# Patient Record
Sex: Female | Born: 1995 | Race: White | Hispanic: No | Marital: Single | Smoking: Light tobacco smoker
Health system: Southern US, Community
[De-identification: ages and names within clinical notes are randomized; demographics above are authoritative.]

## PROBLEM LIST (undated history)

## (undated) DIAGNOSIS — B009 Herpesviral infection, unspecified: Secondary | ICD-10-CM

## (undated) DIAGNOSIS — T7840XA Allergy, unspecified, initial encounter: Secondary | ICD-10-CM

## (undated) HISTORY — PX: MOUTH SURGERY: SHX715

## (undated) HISTORY — DX: Allergy, unspecified, initial encounter: T78.40XA

## (undated) HISTORY — DX: Herpesviral infection, unspecified: B00.9

---

## 2015-06-30 ENCOUNTER — Ambulatory Visit
Admission: RE | Admit: 2015-06-30 | Discharge: 2015-06-30 | Disposition: A | Discharge status: Home / Self Care | Payer: BLUE CROSS/BLUE SHIELD | Source: Ambulatory Visit | Attending: Family Medicine | Admitting: Family Medicine

## 2015-06-30 ENCOUNTER — Other Ambulatory Visit: Payer: Self-pay | Admitting: Family Medicine

## 2015-06-30 DIAGNOSIS — M79671 Pain in right foot: Secondary | ICD-10-CM | POA: Insufficient documentation

## 2015-06-30 DIAGNOSIS — R52 Pain, unspecified: Secondary | ICD-10-CM

## 2015-06-30 DIAGNOSIS — R609 Edema, unspecified: Secondary | ICD-10-CM | POA: Insufficient documentation

## 2015-09-27 DIAGNOSIS — B009 Herpesviral infection, unspecified: Secondary | ICD-10-CM

## 2015-09-27 HISTORY — DX: Herpesviral infection, unspecified: B00.9

## 2016-01-11 ENCOUNTER — Ambulatory Visit (INDEPENDENT_AMBULATORY_CARE_PROVIDER_SITE_OTHER): Payer: Managed Care, Other (non HMO) | Admitting: Obstetrics & Gynecology

## 2016-01-11 ENCOUNTER — Encounter: Payer: Self-pay | Admitting: Obstetrics & Gynecology

## 2016-01-11 VITALS — BP 101/63 | HR 68 | Ht 66.0 in | Wt 131.0 lb

## 2016-01-11 DIAGNOSIS — Z30011 Encounter for initial prescription of contraceptive pills: Secondary | ICD-10-CM | POA: Diagnosis not present

## 2016-01-11 DIAGNOSIS — Z3041 Encounter for surveillance of contraceptive pills: Secondary | ICD-10-CM

## 2016-01-11 MED ORDER — NORETHIN ACE-ETH ESTRAD-FE 1-20 MG-MCG(24) PO TABS: 1.0000 | ORAL_TABLET | Freq: Every day | ORAL | Status: DC

## 2016-01-11 NOTE — Progress Notes (Signed)
   CLINIC ENCOUNTER NOTE  History:  20 y.o. G0 here because she wants to restart OCPs. Has used Safyral in past, was tired and felt she gained a little weight but she wants to restart OCPs.  She denies any abnormal vaginal discharge, bleeding, pelvic pain or other concerns.   Past Medical History  Diagnosis Date  . HSV-1 (herpes simplex virus 1) infection 2017  . Allergy     seasonal    Past Surgical History  Procedure Laterality Date  . Mouth surgery      The following portions of the patient's history were reviewed and updated as appropriate: allergies, current medications, past family history, past medical history, past social history, past surgical history and problem list.   Health Maintenance:  Has received Gardasil series.  Review of Systems:  Pertinent items noted in HPI and remainder of comprehensive ROS otherwise negative.  Objective:  Physical Exam BP 101/63 mmHg  Pulse 68  Ht 5\' 6"  (1.676 m)  Wt 131 lb (59.421 kg)  BMI 21.15 kg/m2  LMP 01/01/2016 CONSTITUTIONAL: Well-developed, well-nourished female in no acute distress.  HENT:  Normocephalic, atraumatic. External right and left ear normal. Oropharynx is clear and moist EYES: Conjunctivae and EOM are normal. Pupils are equal, round, and reactive to light. No scleral icterus.  NECK: Normal range of motion, supple, no masses SKIN: Skin is warm and dry. No rash noted. Not diaphoretic. No erythema. No pallor. NEUROLOGIC: Alert and oriented to person, place, and time. Normal reflexes, muscle tone coordination. No cranial nerve deficit noted. PSYCHIATRIC: Normal mood and affect. Normal behavior. Normal judgment and thought content. CARDIOVASCULAR: Normal heart rate noted RESPIRATORY: Effort and breath sounds normal, no problems with respiration noted ABDOMEN: Soft, no distention noted.   PELVIC: Deferred MUSCULOSKELETAL: Normal range of motion. No edema noted.   Assessment & Plan:  Oral contraceptive  use Counseled about trial of low dose OCP.  Taytulla sample given, generic form prescribed.  Taytulla savings card given. - Norethindrone Acetate-Ethinyl Estrad-FE (LOESTRIN 24 FE) 1-20 MG-MCG(24) tablet; Take 1 tablet by mouth daily.  Dispense: 1 Package; Refill: 11 Routine preventative health maintenance measures emphasized. Please refer to After Visit Summary for other counseling recommendations.   Return in about 3 months (around 04/11/2016) for OCP and BP check.   Total face-to-face time with patient: 20 minutes. Over 50% of encounter was spent on counseling and coordination of care.   Jaynie CollinsUGONNA  Jannette Cotham, MD, FACOG Attending Obstetrician & Gynecologist, [facility]

## 2016-01-11 NOTE — Patient Instructions (Signed)
Return to clinic for any scheduled appointments or for any gynecologic concerns as needed.   

## 2016-01-11 NOTE — Progress Notes (Signed)
[facility].

## 2016-04-19 ENCOUNTER — Other Ambulatory Visit: Payer: Self-pay | Admitting: Obstetrics & Gynecology

## 2016-04-19 DIAGNOSIS — Z3041 Encounter for surveillance of contraceptive pills: Secondary | ICD-10-CM

## 2016-05-16 ENCOUNTER — Telehealth: Payer: Self-pay | Admitting: *Deleted

## 2016-05-16 DIAGNOSIS — Z3041 Encounter for surveillance of contraceptive pills: Secondary | ICD-10-CM

## 2016-05-16 MED ORDER — NORETHIN ACE-ETH ESTRAD-FE 1-20 MG-MCG(24) PO TABS: 1.0000 | ORAL_TABLET | Freq: Every day | ORAL | 3 refills | Status: DC

## 2016-05-16 NOTE — Telephone Encounter (Signed)
Sent refill on birth control to pharmacy, left pt a message that medication had been filled.

## 2016-05-24 ENCOUNTER — Encounter: Payer: Self-pay | Admitting: Obstetrics & Gynecology

## 2016-05-24 ENCOUNTER — Ambulatory Visit (INDEPENDENT_AMBULATORY_CARE_PROVIDER_SITE_OTHER): Payer: 59 | Admitting: Obstetrics & Gynecology

## 2016-05-24 VITALS — BP 113/64 | HR 72 | Ht 66.0 in | Wt 141.0 lb

## 2016-05-24 DIAGNOSIS — N921 Excessive and frequent menstruation with irregular cycle: Secondary | ICD-10-CM | POA: Diagnosis not present

## 2016-05-24 DIAGNOSIS — R233 Spontaneous ecchymoses: Secondary | ICD-10-CM

## 2016-05-24 DIAGNOSIS — R238 Other skin changes: Secondary | ICD-10-CM

## 2016-05-24 NOTE — Patient Instructions (Signed)
Thank you for enrolling in MyChart. Please follow the instructions below to securely access your online medical record. MyChart allows you to send messages to your doctor, view your test results, manage appointments, and more.   How Do I Sign Up? 1. In your Internet browser, go to [employer] Address [employer] and enter [email or web address]. 2. Click on the Sign Up Now link in the Sign In box. You will see the New Member Sign Up page. 3. Enter your MyChart Access Code exactly as it appears below. You will not need to use this code after you've completed the sign-up process. If you do not sign up before the expiration date, you must request a new code.  MyChart Access Code: [email or web address] Expires: 07/23/2016  3:22 PM  4. Enter your Social Security Number ([id number]) and Date of Birth (mm/dd/yyyy) as indicated and click Submit. You will be taken to the next sign-up page. 5. Create a MyChart ID. This will be your MyChart login ID and cannot be changed, so think of one that is secure and easy to remember. 6. Create a MyChart password. You can change your password at any time. 7. Enter your Password Reset Question and Answer. This can be used at a later time if you forget your password.  8. Enter your e-mail address. You will receive e-mail notification when new information is available in MyChart. 9. Click Sign Up. You can now view your medical record.   Additional Information Remember, MyChart is NOT to be used for urgent needs. For medical emergencies, dial 911.

## 2016-05-25 NOTE — Progress Notes (Signed)
   GYNECOLOGY VISIT NOTE  History:  20 y.o. G0 here today with complaint of one episode of prolonged bleeding. Was recently started on a 20 mcg OCP since 12/2015, had no problems until last month when she had bleeding for 17 days.  Bleeding started after resumption of sexual activity; also had other life changes/stresssors around that time.   No bleeding since, and none currently. She is satisfied with her OCPs. She does want evaluation for chronic history of easy bruising; started intensive exercise lately and noticed more bruising than usual.  Reports she has always bruised easily. She denies any abnormal vaginal discharge, pelvic pain or other concerns.   Past Medical History:  Diagnosis Date  . Allergy    seasonal  . HSV-1 (herpes simplex virus 1) infection 2017    Past Surgical History:  Procedure Laterality Date  . MOUTH SURGERY     The following portions of the patient's history were reviewed and updated as appropriate: allergies, current medications, past family history, past medical history, past social history, past surgical history and problem list.    Review of Systems:  Pertinent items noted in HPI and remainder of comprehensive ROS otherwise negative.  Objective:  Physical Exam BP 113/64   Pulse 72   Ht 5\' 6"  (1.676 m)   Wt 141 lb (64 kg)   LMP 05/04/2016 Comment: 17 days long  BMI 22.76 kg/m  CONSTITUTIONAL: Well-developed, well-nourished female in no acute distress.  HENT:  Normocephalic, atraumatic. External right and left ear normal. Oropharynx is clear and moist EYES: Conjunctivae and EOM are normal. Pupils are equal, round, and reactive to light. No scleral icterus.  NECK: Normal range of motion, supple, no masses SKIN: Skin is warm and dry. No rash noted. Not diaphoretic. No erythema. No pallor. NEUROLOGIC: Alert and oriented to person, place, and time. Normal reflexes, muscle tone coordination. No cranial nerve deficit noted. PSYCHIATRIC: Normal mood and  affect. Normal behavior. Normal judgment and thought content. CARDIOVASCULAR: Normal heart rate noted RESPIRATORY: Effort and breath sounds normal, no problems with respiration noted ABDOMEN: Soft, no distention noted.   PELVIC: Deferred by patient MUSCULOSKELETAL: Normal range of motion. No edema noted.  Labs and Imaging No results found.  Assessment & Plan:  1. Easy bruising Will do initial evaluation. May need referral to Hematology for any abnormal results. - CBC - INR/PT - PTT - Von Willebrand panel  2. Breakthrough bleeding (BTB) on oral contraceptive Will continue current OCPs for now. If BTB continues, evaluate for ectropion (given that bleeding started after resumption of intercourse) or other structural problems.  Also may need to change OCP or increase to 30/35 mcg formulation.   Routine preventative health maintenance measures emphasized. Please refer to After Visit Summary for other counseling recommendations.    Total face-to-face time with patient: 15 minutes. Over 50% of encounter was spent on counseling and coordination of care.   Jaynie CollinsUGONNA  ANYANWU, MD, FACOG Attending Obstetrician & Gynecologist, [facility], [facility]

## 2016-05-27 LAB — CBC
HEMATOCRIT: 40.3 % (ref 35.0–45.0)
HEMOGLOBIN: 13.5 g/dL (ref 11.7–15.5)
MCH: 30.3 pg (ref 27.0–33.0)
MCHC: 33.5 g/dL (ref 32.0–36.0)
MCV: 90.6 fL (ref 80.0–100.0)
MPV: 10.2 fL (ref 7.5–12.5)
Platelets: 279 10*3/uL (ref 140–400)
RBC: 4.45 MIL/uL (ref 3.80–5.10)
RDW: 13.5 % (ref 11.0–15.0)
WBC: 8.2 10*3/uL (ref 3.8–10.8)

## 2016-05-28 LAB — PROTIME-INR
INR: 0.9
Prothrombin Time: 10.1 s (ref 9.0–11.5)

## 2016-05-28 LAB — APTT: APTT: 30 s (ref 22–34)

## 2016-06-02 LAB — VON WILLEBRAND PANEL
Coagulation Factor VIII: 83 % (ref 50–180)
RISTOCETIN CO-FACTOR, PLASMA: 77 % (ref 42–200)
Von Willebrand Antigen, Plasma: 99 % (ref 50–217)

## 2016-06-07 ENCOUNTER — Telehealth: Payer: Self-pay | Admitting: *Deleted

## 2016-06-07 NOTE — Telephone Encounter (Signed)
-----   Message from Tereso NewcomerUgonna A Anyanwu, MD sent at 06/03/2016  8:42 PM EDT ----- Negative evaluation for easy bruising.  Will need to see Hematology if she wants any more specialized testing.  [facility] ----- Message ----- From: Arne ClevelandMandy J Serayah Yazdani, CMA Sent: 06/02/2016   4:11 PM To: Tereso NewcomerUgonna A Anyanwu, MD  Pt called wanting results - Please advise.

## 2016-06-10 ENCOUNTER — Telehealth: Payer: Self-pay | Admitting: *Deleted

## 2016-06-10 DIAGNOSIS — N926 Irregular menstruation, unspecified: Secondary | ICD-10-CM

## 2016-06-10 MED ORDER — NORGESTIMATE-ETH ESTRADIOL 0.25-35 MG-MCG PO TABS: 1.0000 | ORAL_TABLET | Freq: Every day | ORAL | 3 refills | Status: DC

## 2016-06-10 NOTE — Telephone Encounter (Signed)
-----   Message from Olevia BowensJacinda S Battle sent at 06/10/2016 10:49 AM EDT ----- Regarding: OCP/AUB/Advise Contact: [phone] States she was told if she is still having AUB w/ OCP to let us know and we could change her RX or up the dosage. She states she is still bleeding around 9 days at a time

## 2016-06-10 NOTE — Telephone Encounter (Signed)
Called pt, no answer, left message that the physician would increase dosage of the birth control to help with the bleeding.  New rx sent to pharmacy noted in chart.  Instructed pt to call back if that is incorrect and advised to give it up to 3 months to adjust.

## 2016-06-10 NOTE — Telephone Encounter (Signed)
Pt called back [facility] [place] resent to that location.

## 2016-06-17 IMAGING — CR DG FOOT COMPLETE 3+V*R*
1 series · 3 of 3 positions shown · non-contrast
Comparison: None in PACs

CLINICAL DATA: Contusion of the right foot 1 month ago with
persistent pain and swelling over the first metatarsal region

EXAM:
RIGHT FOOT COMPLETE - 3+ VIEW

[Series 1: dg foot complete right · 0.14mm/px · 3 of 3 slices shown]
[im 1/3]
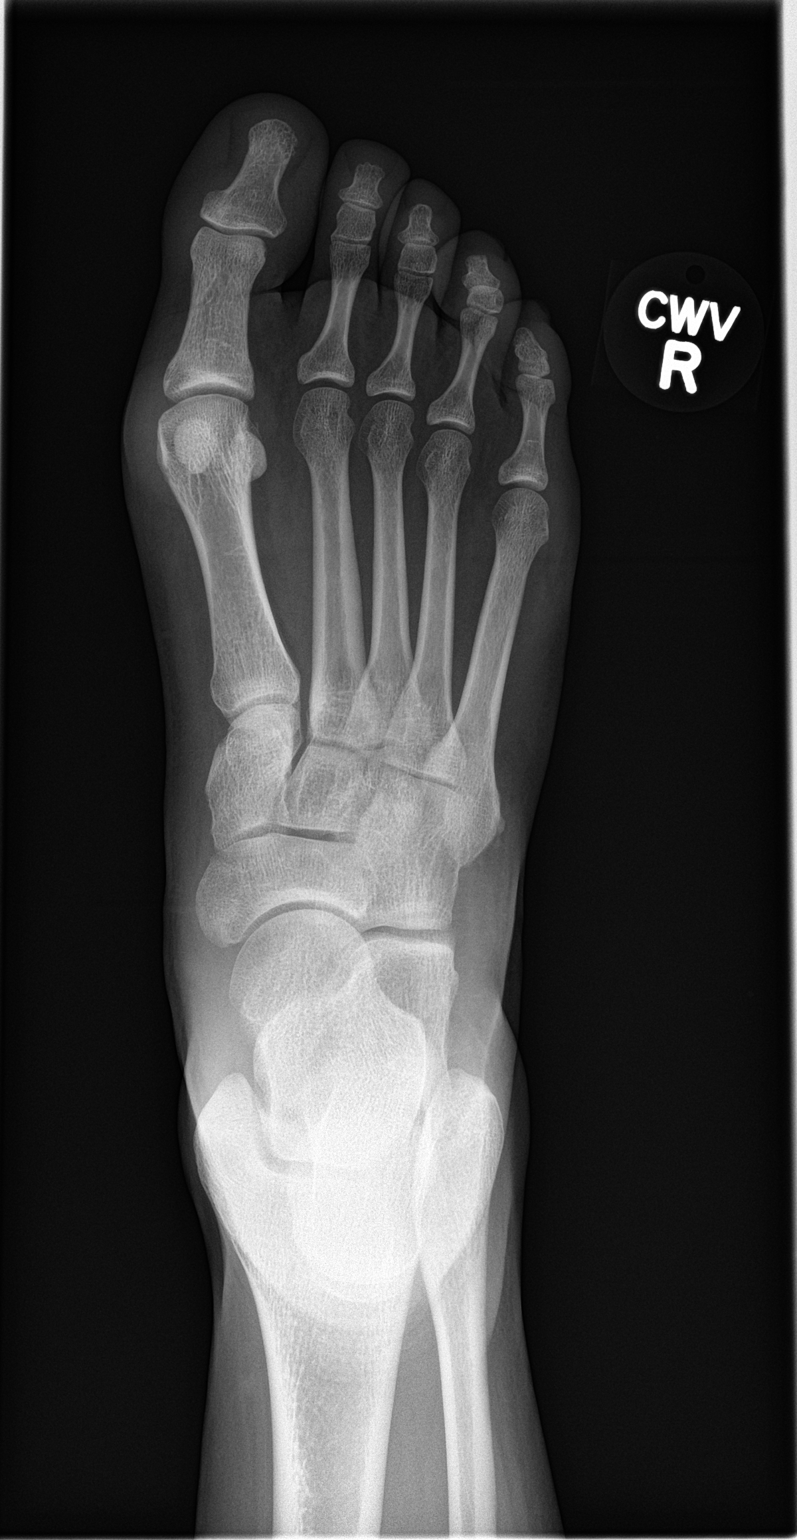
[im 2/3]
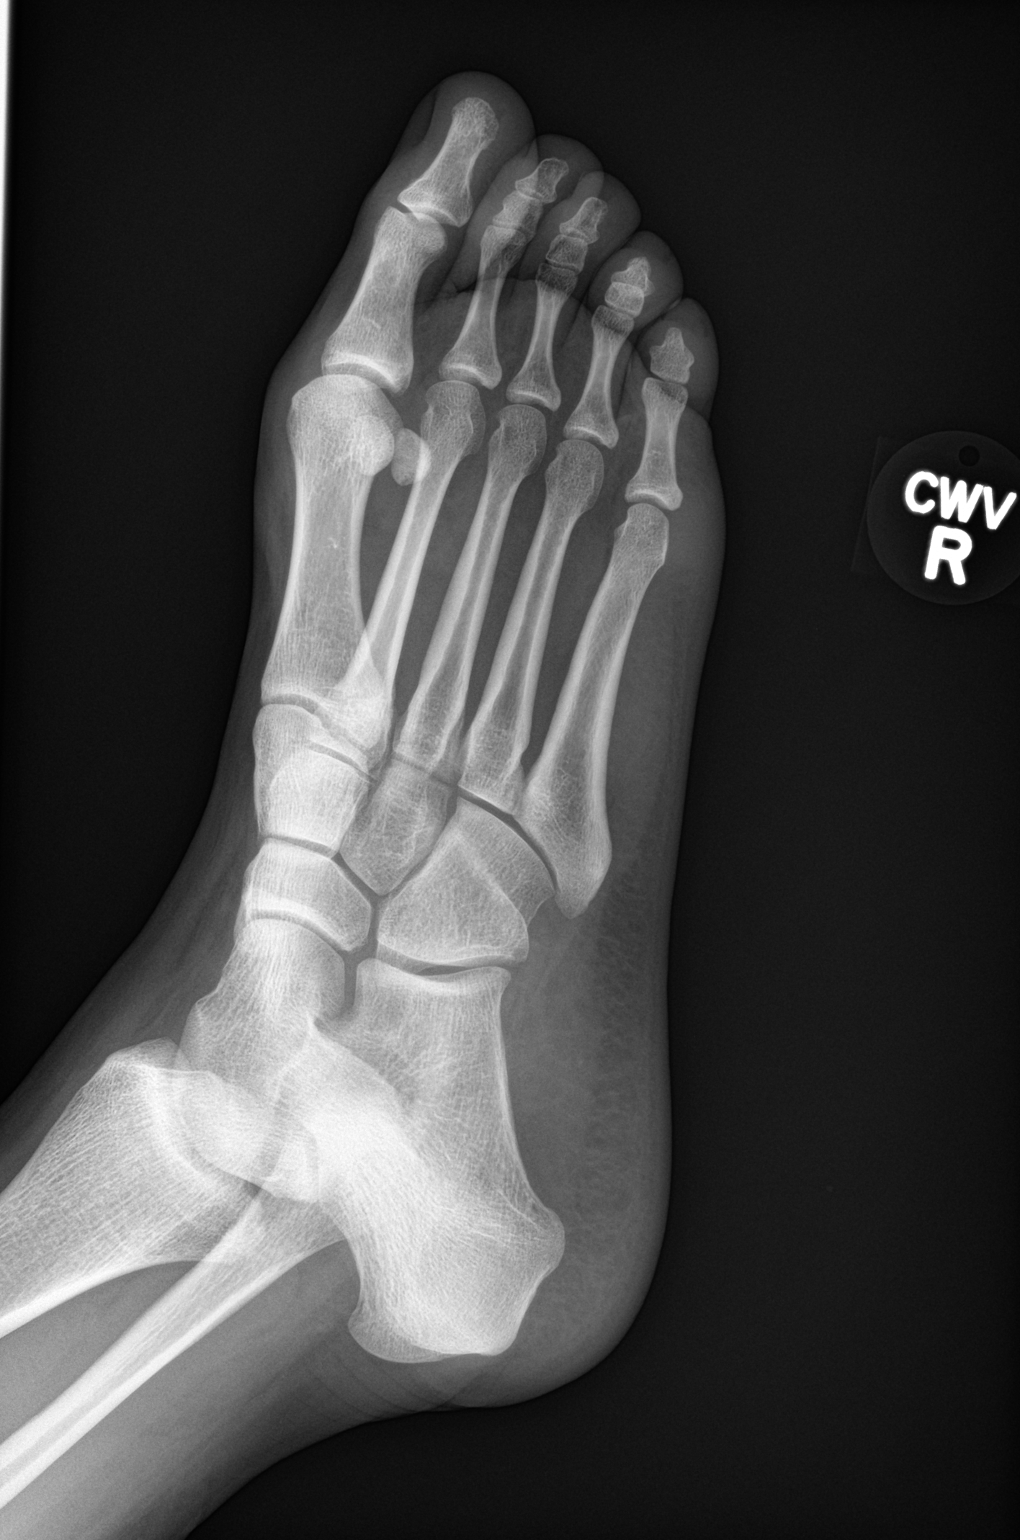
[im 3/3]
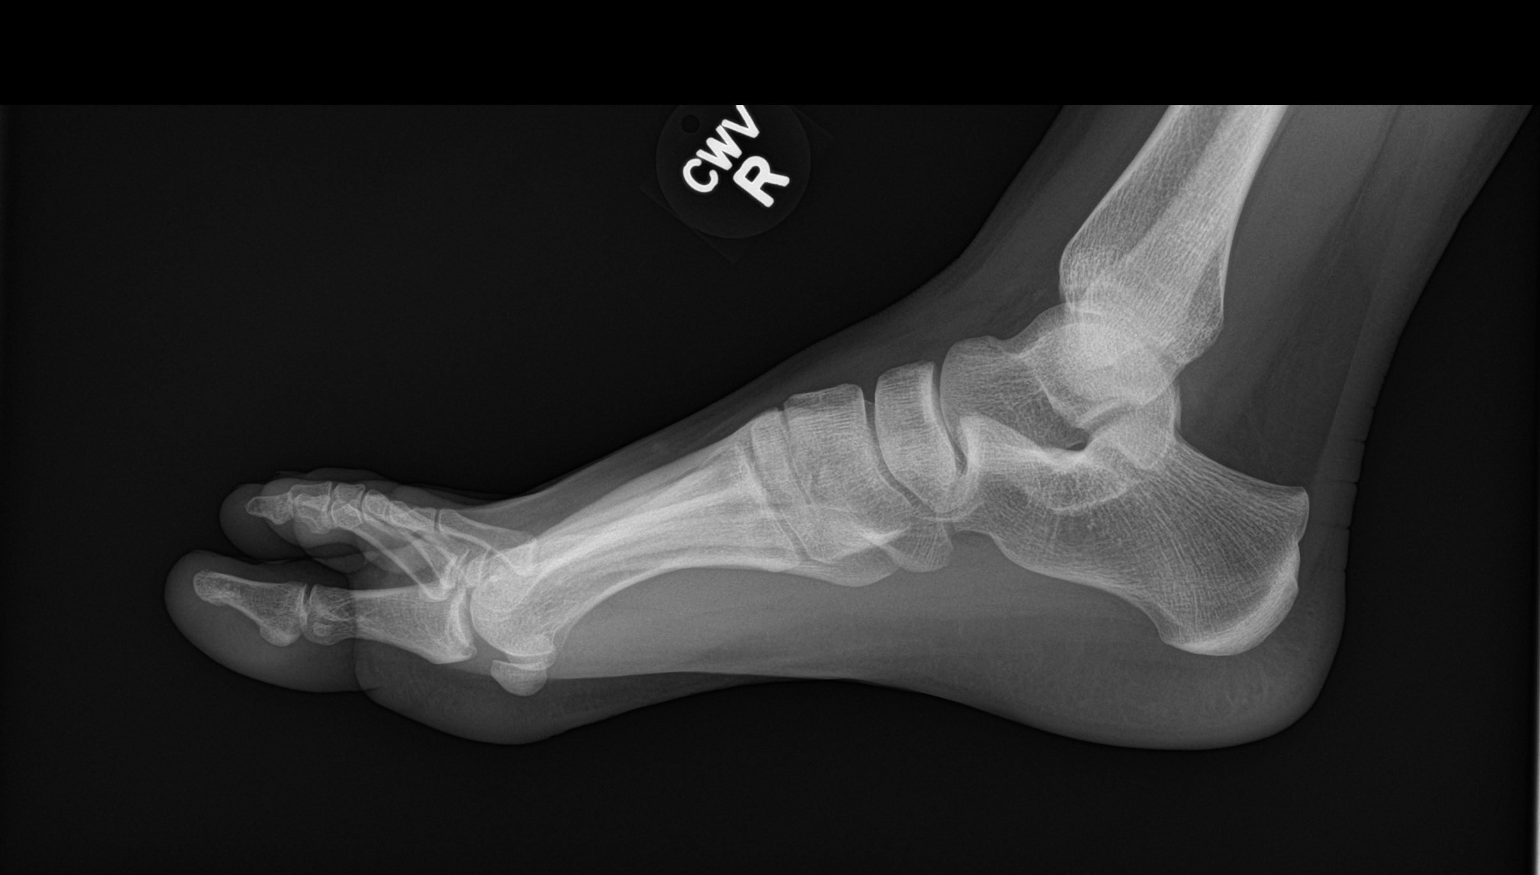

[3 of 3 positions shown; findings below may reference images not displayed]

FINDINGS: The bones of the foot are adequately mineralized. There is no acute
or healing fracture. There is no lytic nor blastic lesion the joint
spaces are well maintained. There is mild soft tissue swelling over
the first MTP joint.
IMPRESSION: There is no acute bony abnormality of the right foot. There is mild
soft tissue swelling surrounding the first MTP joint.

## 2016-08-22 ENCOUNTER — Ambulatory Visit: Payer: 59 | Admitting: Obstetrics & Gynecology

## 2016-08-22 DIAGNOSIS — Z3041 Encounter for surveillance of contraceptive pills: Secondary | ICD-10-CM

## 2017-01-05 ENCOUNTER — Encounter: Payer: Self-pay | Admitting: Obstetrics and Gynecology

## 2017-01-05 ENCOUNTER — Ambulatory Visit (INDEPENDENT_AMBULATORY_CARE_PROVIDER_SITE_OTHER): Payer: 59 | Admitting: Obstetrics and Gynecology

## 2017-01-05 ENCOUNTER — Ambulatory Visit: Payer: 59 | Admitting: Obstetrics and Gynecology

## 2017-01-05 VITALS — BP 122/75 | HR 79 | Ht 66.0 in | Wt 137.0 lb

## 2017-01-05 DIAGNOSIS — N939 Abnormal uterine and vaginal bleeding, unspecified: Secondary | ICD-10-CM | POA: Diagnosis not present

## 2017-01-05 DIAGNOSIS — Z3041 Encounter for surveillance of contraceptive pills: Secondary | ICD-10-CM

## 2017-01-05 DIAGNOSIS — Z113 Encounter for screening for infections with a predominantly sexual mode of transmission: Secondary | ICD-10-CM

## 2017-01-05 MED ORDER — NORGESTIM-ETH ESTRAD TRIPHASIC 0.18/0.215/0.25 MG-25 MCG PO TABS: 1.0000 | ORAL_TABLET | Freq: Every day | ORAL | 2 refills | Status: DC

## 2017-01-05 NOTE — Progress Notes (Signed)
Pt here for contraception management. She changed OCP's approx 6 months ago but has experienced heavy and extended menstrual cycles since then. She states she has started her cycle about a week before placebo pills twice in the past 6 months. Most recently she started bleeding on 12/10/16 and continued through yesterday 01/04/17. She c/o extremely heavy with leg cramps, back pain, abd pain, and headaches when she is bleeding.

## 2017-01-05 NOTE — Progress Notes (Signed)
Obstetrics and Gynecology Established Patient Evaluation  Appointment Date: 01/05/2017  OBGYN Clinic: [facility]  Primary Care Provider: Pcp Not In System  Referring Provider: self  Chief Complaint:  Chief Complaint  Patient presents with  . Contraception    History of Present Illness: Kendra Hopkins is a 21 y.o. Caucasian G0 (Patient's last menstrual period was 12/10/2016.), seen for the above chief complaint. Her past medical history is significant for nothing   She states that for the past 2-3 cycle she's had irregular VB. It seems to start a few day before her placebo pills and then she continues to have bleeding after her week of placebo and into the hormone pills. It most recently lasted from about mid march until a few days ago.  She was restarted on OCPs (for BC mainly) about a year ago and she's been changed to different formulations and is currently on sprintec. She was on OCPs in the past and didn't have BTB with it. No s/s of anemia, abdominal pain   Review of Systems: as noted in the History of Present Illness.  Past Medical History:  Past Medical History:  Diagnosis Date  . Allergy    seasonal  . HSV-1 (herpes simplex virus 1) infection 2017    Past Surgical History:  Past Surgical History:  Procedure Laterality Date  . MOUTH SURGERY      Past Obstetrical History:  OB History  Gravida Para Term Preterm AB Living  0 0 0 0 0 0  SAB TAB Ectopic Multiple Live Births  0 0 0 0 0        Past Gynecological History: As per HPI. No h/o STI Patient states she's completed the HPV vaccine series.  She is sexually active.   Social History:  Social History   Social History  . Marital status: Single    Spouse name: N/A  . Number of children: N/A  . Years of education: N/A   Occupational History  . Not on file.   Social History Main Topics  . Smoking status: Light Tobacco Smoker    Packs/day: 0.10    Types: Cigarettes  . Smokeless  tobacco: Never Used  . Alcohol use Yes     Comment: social  . Drug use: No  . Sexual activity: Yes    Birth control/ protection: Pill   Other Topics Concern  . Not on file   Social History Narrative  . No narrative on file    Family History:  Family History  Problem Relation Age of Onset  . Diabetes Maternal Grandmother   . Cancer Maternal Grandfather   . Cancer Paternal Grandfather    Medications Sprintec  Allergies Patient has no known allergies.   Physical Exam:  BP 122/75   Pulse 79   Ht  (1.676 m)   Wt 137 lb (62.1 kg)   LMP 12/10/2016   BMI 22.11 kg/m  Body mass index is 22.11 kg/m. General appearance: Well nourished, well developed female in no acute distress.   Laboratory: None  Radiology: None  Assessment: Patient stable  Plan:  1. Encounter for surveillance of contraceptive pills D/w her re other methods of BC in particular Mirena or nuva rings, as they tend to cause less BTB and AUB. I also d/w her trying a different formulation of OCP; she's been on monophasic and two different types. Given this, I offered trying a triphasic pill to see if this can give her better BTB control. She is interested  in the ring (no samples today) and the Mirena, so brochures given and she'd like to try the triphasic pills in the interim and possibly stay on these if she likes them. Pt amenable to urine STI screening.  Pt also told that she can call us for her first pap smear after she turns 21, which she is amenable to  - Cervicovaginal ancillary only  RTC PRN  Cornelia Copa MD Attending Center for [facility])

## 2017-01-06 LAB — CERVICOVAGINAL ANCILLARY ONLY
Chlamydia: NEGATIVE
Neisseria Gonorrhea: NEGATIVE
TRICH (WINDOWPATH): NEGATIVE

## 2017-01-09 ENCOUNTER — Telehealth: Payer: Self-pay | Admitting: *Deleted

## 2017-01-09 NOTE — Telephone Encounter (Signed)
-----   Message from Sparks Bing, MD sent at 01/08/2017  6:21 PM EDT ----- Please call her and let her know that everything is negative.

## 2017-01-09 NOTE — Telephone Encounter (Signed)
Informed pt of normal results

## 2017-09-13 ENCOUNTER — Other Ambulatory Visit: Payer: Self-pay | Admitting: *Deleted

## 2017-09-13 MED ORDER — NORGESTIM-ETH ESTRAD TRIPHASIC 0.18/0.215/0.25 MG-25 MCG PO TABS: 1.0000 | ORAL_TABLET | Freq: Every day | ORAL | 1 refills | Status: AC
